# Patient Record
Sex: Female | Born: 1985 | Race: Black or African American | Marital: Single | Smoking: Never smoker
Health system: Southern US, Community
[De-identification: ages and names within clinical notes are randomized; demographics above are authoritative.]

## PROBLEM LIST (undated history)

## (undated) DIAGNOSIS — Z8719 Personal history of other diseases of the digestive system: Secondary | ICD-10-CM

## (undated) DIAGNOSIS — G43909 Migraine, unspecified, not intractable, without status migrainosus: Secondary | ICD-10-CM

## (undated) DIAGNOSIS — L0591 Pilonidal cyst without abscess: Secondary | ICD-10-CM

## (undated) DIAGNOSIS — F419 Anxiety disorder, unspecified: Secondary | ICD-10-CM

## (undated) HISTORY — PX: WISDOM TOOTH EXTRACTION: SHX21

---

## 2018-08-01 ENCOUNTER — Other Ambulatory Visit: Payer: Self-pay | Admitting: General Surgery

## 2018-08-01 DIAGNOSIS — K611 Rectal abscess: Secondary | ICD-10-CM

## 2018-08-13 ENCOUNTER — Ambulatory Visit
Admission: RE | Admit: 2018-08-13 | Discharge: 2018-08-13 | Disposition: A | Discharge status: Home / Self Care | Payer: BLUE CROSS/BLUE SHIELD | Source: Ambulatory Visit | Attending: General Surgery | Admitting: General Surgery

## 2018-08-13 DIAGNOSIS — K611 Rectal abscess: Secondary | ICD-10-CM

## 2018-08-13 MED ORDER — GADOBENATE DIMEGLUMINE 529 MG/ML IV SOLN
12.0000 mL | Freq: Once | INTRAVENOUS | Status: AC | PRN
  Administered 2018-08-13: 12 mL via INTRAVENOUS

## 2018-08-21 ENCOUNTER — Ambulatory Visit: Payer: Self-pay | Admitting: General Surgery

## 2018-08-21 NOTE — H&P (View-Only) (Signed)
  History of Present Illness (Nevaeha Finerty MD; 08/21/2018 9:51 AM) The patient is a 32 year old female who presents with a subcutaneous abscess. She was seen at Urgent Care on 05/23/18 with complaints of anal pain on her left buttock but no infection was found, placed on Bactrim. SHe developed worsening symptoms and I&D of perianal abscess was performed, evacuating purulent material. She followed up with them but was noted to still have drainage so her antibiotics were changed to clindamycin and she was referred to us.  [name] saw her on 05/29/18, she had a large area of induration and central fluctuance on her left buttock. However, bedside I&D revealed only bloody/serosanguineous material.  She states since then, she has had recurrent infections up proximally once a month. One of these required anabiotic's. During this time the wound opened and drained for several days.  Previous hx: She states in November 2016 she started having flareups of swelling to the same area about once a month for a few years. The flares have become less frequent with her last flare being in March. She has never had the area lanced and it has never spontaneously drained purulence in the past. It normally fully resolves with sitz baths. She denies history of Crohn's or ulcerative colitis. She is otherwise healthy, does not have diabetes, does not smoke, and is not on blood thinners.   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 Brown, RMA; 08/21/2018 9:39 AM) No Known Drug Allergies [05/28/2018]: Allergies Reconciled  Medication History (Tanisha A. Brown, RMA; 08/21/2018 9:40  AM) Sulfamethoxazole-Trimethoprim (800-160MG Tablet, Oral) Active. Medications Reconciled  Social History (Domanick Cuccia, MD; 08/21/2018 9:51 AM) Alcohol use Occasional alcohol use. Caffeine use Coffee, Tea. No drug use Tobacco use Never smoker.  Family History (Jaimi Belle, MD; 08/21/2018 9:51 AM) Alcohol Abuse Father. Arthritis Mother. Hypertension Father, Mother.  Pregnancy / Birth History (Steven Basso, MD; 08/21/2018 9:51 AM) Age at menarche 13 years. Contraceptive History Contraceptive implant. Gravida 0 Para 0 Regular periods  Other Problems (Adalind Weitz, MD; 08/21/2018 9:51 AM) Anxiety Disorder Migraine Headache     Review of Systems (Janiylah Hannis MD; 08/21/2018 9:51 AM) General Not Present- Appetite Loss, Chills, Fatigue, Fever, Night Sweats, Weight Gain and Weight Loss. Skin Not Present- Change in Wart/Mole, Dryness, Hives, Jaundice, New Lesions, Non-Healing Wounds, Rash and Ulcer. HEENT Not Present- Earache, Hearing Loss, Hoarseness, Nose Bleed, Oral Ulcers, Ringing in the Ears, Seasonal Allergies, Sinus Pain, Sore Throat, Visual Disturbances, Wears glasses/contact lenses and Yellow Eyes. Respiratory Not Present- Bloody sputum, Chronic Cough, Difficulty Breathing, Snoring and Wheezing. Breast Not Present- Breast Mass, Breast Pain, Nipple Discharge and Skin Changes. Cardiovascular Not Present- Chest Pain, Difficulty Breathing Lying Down, Leg Cramps, Palpitations, Rapid Heart Rate, Shortness of Breath and Swelling of Extremities. Gastrointestinal Not Present- Abdominal Pain, Bloating, Bloody Stool, Change in Bowel Habits, Chronic diarrhea, Constipation, Difficulty Swallowing, Excessive gas, Gets full quickly at meals, Hemorrhoids, Indigestion, Nausea, Rectal Pain and Vomiting. Female Genitourinary Not Present- Frequency, Nocturia, Painful Urination, Pelvic Pain and Urgency. Musculoskeletal Not Present- Back Pain, Joint Pain, Joint Stiffness, Muscle  Pain, Muscle Weakness and Swelling of Extremities. Neurological Not Present- Decreased Memory, Fainting, Headaches, Numbness, Seizures, Tingling, Tremor, Trouble walking and Weakness. Endocrine Not Present-   Cold Intolerance, Excessive Hunger, Hair Changes, Heat Intolerance, Hot flashes and New Diabetes. Hematology Not Present- Blood Thinners, Easy Bruising, Excessive bleeding, Gland problems, HIV and Persistent Infections.  Vitals (Tanisha A. Brown RMA; 08/21/2018 9:39 AM) 08/21/2018 9:39 AM Weight: 133 lb Height: 63in Body Surface Area: 1.63 m Body Mass Index: 23.56 kg/m  Temp.: 98.2F  Pulse: 95 (Regular)  BP: 124/86 (Sitting, Left Arm, Standard)      Physical Exam (Klaryssa Fauth MD; 08/21/2018 9:52 AM)  The physical exam findings are as follows: Note:GENERAL: Well-developed, well nourished female in no acute distress  EYES: No scleral icterus Pupils equal, lids normal  EXTERNAL EARS: Intact, no masses or lesions EXTERNAL NOSE: Intact, no masses or lesions MOUTH: Lips - no lesions Dentition - normal for age  RESPIRATORY: Normal effort, no use of accessory muscles  MUSCULOSKELETAL: Normal gait Grossly normal ROM upper extremities Grossly normal ROM lower extremities  SKIN: Warm and dry Not diaphoretic  PSYCHIATRIC: Normal judgement and insight Normal mood and affect Alert, oriented x 3  Rectal Note: Left superior buttock, adjacent to intergluteal crease: There is an area of induration about 5 cm x 5 cm with no fluctuance No overlying erythema There is an I&D site in the intergluteal cleft which is not draining.    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 I was unable to identify a source for these abscesses and therefore I obtained an MRI. This shows a 1.6 x 0.9 cm superficial collection in the high left medial  gluteal cleft more consistent with pilonidal disease. I do not see any pilonidal pits on exam. She is having recurrent infections approximately once a month. I have recommended excision of this area with primary closure. We have discussed the typical healing time and postoperative pain as well as chance of recurrence. Other risks include bleeding and wound infection.  Current Plans 

## 2018-08-21 NOTE — H&P (Signed)
History of Present Illness Margaret Levee MD; 08/21/2018 9:51 AM) The patient is a 32 year old female who presents with a subcutaneous abscess. She was seen at Urgent Care on 05/23/18 with complaints of anal pain on her left buttock but no infection was found, placed on Bactrim. SHe developed worsening symptoms and I&D of perianal abscess was performed, evacuating purulent material. She followed up with them but was noted to still have drainage so her antibiotics were changed to clindamycin and she was referred to [place].  Margaret Chavez saw her on 05/29/18, she had a large area of induration and central fluctuance on her left buttock. However, bedside I&D revealed only bloody/serosanguineous material.  She states since then, she has had recurrent infections up proximally once a month. One of these required anabiotic's. During this time the wound opened and drained for several days.  Previous hx: She states in November 2016 she started having flareups of swelling to the same area about once a month for a few years. The flares have become less frequent with her last flare being in March. She has never had the area lanced and it has never spontaneously drained purulence in the past. It normally fully resolves with sitz baths. She denies history of Crohn's or ulcerative colitis. She is otherwise healthy, does not have diabetes, does not smoke, and is not on blood thinners.   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 Medications Reconciled  Social History Margaret Levee, MD; 08/21/2018 9:51 AM) Alcohol use Occasional alcohol use. Caffeine use Coffee, Tea. No drug use Tobacco use Never smoker.  Family History Margaret Levee, MD; 08/21/2018 9:51 AM) Alcohol Abuse Margaret Chavez. Arthritis Margaret Chavez. Hypertension Margaret Chavez, Margaret Chavez.  Pregnancy / Birth History Margaret Levee, MD; 08/21/2018 9:51 AM) Age at menarche 13 years. Contraceptive History Contraceptive implant. Gravida 0 Para 0 Regular periods  Other Problems Margaret Levee, MD; 08/21/2018 9:51 AM) Anxiety Disorder Migraine Headache     Review of Systems Margaret Levee MD; 08/21/2018 9:51 AM) General Not Present- Appetite Loss, Chills, Fatigue, Fever, Night Sweats, Weight Gain and Weight Loss. Skin Not Present- Change in Wart/Mole, Dryness, Hives, Jaundice, New Lesions, Non-Healing Wounds, Rash and Ulcer. HEENT Not Present- Earache, Hearing Loss, Hoarseness, Nose Bleed, Oral Ulcers, Ringing in the Ears, Seasonal Allergies, Sinus Pain, Sore Throat, Visual Disturbances, Wears glasses/contact lenses and Yellow Eyes. Respiratory Not Present- Bloody sputum, Chronic Cough, Difficulty Breathing, Snoring and Wheezing. Breast Not Present- Breast Mass, Breast Pain, Nipple Discharge and Skin Changes. Cardiovascular Not Present- Chest Pain, Difficulty Breathing Lying Down, Leg Cramps, Palpitations, Rapid Heart Rate, Shortness of Breath and Swelling of Extremities. Gastrointestinal Not Present- Abdominal Pain, Bloating, Bloody Stool, Change in Bowel Habits, Chronic diarrhea, Constipation, Difficulty Swallowing, Excessive gas, Gets full quickly at meals, Hemorrhoids, Indigestion, Nausea, Rectal Pain and Vomiting. Female Genitourinary Not Present- Frequency, Nocturia, Painful Urination, Pelvic Pain and Urgency. Musculoskeletal Not Present- Back Pain, Joint Pain, Joint Stiffness, Muscle  Pain, Muscle Weakness and Swelling of Extremities. Neurological Not Present- Decreased Memory, Fainting, Headaches, Numbness, Seizures, Tingling, Tremor, Trouble walking and Weakness. Endocrine Not Present-  Cold Intolerance, Excessive Hunger, Hair Changes, Heat Intolerance, Hot flashes and New Diabetes. Hematology Not Present- Blood Thinners, Easy Bruising, Excessive bleeding, Gland problems, HIV and Persistent Infections.  Vitals (Margaret Chavez RMA; 08/21/2018 9:39 AM) 08/21/2018 9:39 AM Weight: 133 lb Height: 63in Body Surface Area: 1.63 m Body Mass Index: 23.56 kg/m  Temp.: 98.108F  Pulse: 95 (Regular)  BP: 124/86 (Sitting, Left Arm, Standard)      Physical Exam Margaret Levee MD; 08/21/2018 9:52 AM)  The physical exam findings are as follows: Note:GENERAL: Well-developed, well nourished female in no acute distress  EYES: No scleral icterus Pupils equal, lids normal  EXTERNAL EARS: Intact, no masses or lesions EXTERNAL NOSE: Intact, no masses or lesions MOUTH: Lips - no lesions Dentition - normal for age  RESPIRATORY: Normal effort, no use of accessory muscles  MUSCULOSKELETAL: Normal gait Grossly normal ROM upper extremities Grossly normal ROM lower extremities  SKIN: Warm and dry Not diaphoretic  PSYCHIATRIC: Normal judgement and insight Normal mood and affect Alert, oriented x 3  Rectal Note: Left superior buttock, adjacent to intergluteal crease: There is an area of induration about 5 cm x 5 cm with no fluctuance No overlying erythema There is an I&D site in the intergluteal cleft which is not draining.    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 I was unable to identify a source for these abscesses and therefore I obtained an MRI. This shows a 1.6 x 0.9 cm superficial collection in the high left medial  gluteal cleft more consistent with pilonidal disease. I do not see any pilonidal pits on exam. She is having recurrent infections approximately once a month. I have recommended excision of this area with primary closure. We have discussed the typical healing time and postoperative pain as well as chance of recurrence. Other risks include bleeding and wound infection.  Current Plans

## 2018-09-06 ENCOUNTER — Encounter (HOSPITAL_BASED_OUTPATIENT_CLINIC_OR_DEPARTMENT_OTHER): Payer: Self-pay | Admitting: *Deleted

## 2018-09-06 ENCOUNTER — Other Ambulatory Visit: Payer: Self-pay

## 2018-09-06 NOTE — Progress Notes (Signed)
Spoke w/ pt via phone for pre-op interview.  Npo after mn.  Arrive at [facility].   Needs urine preg.

## 2018-09-13 ENCOUNTER — Encounter (HOSPITAL_BASED_OUTPATIENT_CLINIC_OR_DEPARTMENT_OTHER): Payer: Self-pay | Admitting: Anesthesiology

## 2018-09-13 ENCOUNTER — Ambulatory Visit (HOSPITAL_BASED_OUTPATIENT_CLINIC_OR_DEPARTMENT_OTHER): Payer: BLUE CROSS/BLUE SHIELD | Admitting: Anesthesiology

## 2018-09-13 ENCOUNTER — Encounter (HOSPITAL_BASED_OUTPATIENT_CLINIC_OR_DEPARTMENT_OTHER)
Admission: RE | Disposition: A | Discharge status: Home / Self Care | Payer: Self-pay | Source: Ambulatory Visit | Attending: General Surgery

## 2018-09-13 ENCOUNTER — Ambulatory Visit (HOSPITAL_BASED_OUTPATIENT_CLINIC_OR_DEPARTMENT_OTHER)
Admission: RE | Admit: 2018-09-13 | Discharge: 2018-09-13 | Disposition: A | Discharge status: Home / Self Care | Payer: BLUE CROSS/BLUE SHIELD | Source: Ambulatory Visit | Attending: General Surgery | Admitting: General Surgery

## 2018-09-13 DIAGNOSIS — L0591 Pilonidal cyst without abscess: Secondary | ICD-10-CM | POA: Insufficient documentation

## 2018-09-13 HISTORY — DX: Anxiety disorder, unspecified: F41.9

## 2018-09-13 HISTORY — DX: Personal history of other diseases of the digestive system: Z87.19

## 2018-09-13 HISTORY — PX: PILONIDAL CYST EXCISION: SHX744

## 2018-09-13 HISTORY — DX: Migraine, unspecified, not intractable, without status migrainosus: G43.909

## 2018-09-13 HISTORY — DX: Pilonidal cyst without abscess: L05.91

## 2018-09-13 LAB — POCT PREGNANCY, URINE: Preg Test, Ur: NEGATIVE

## 2018-09-13 SURGERY — EXCISION, SIMPLE PILONIDAL CYST
Anesthesia: Monitor Anesthesia Care | Site: Coccyx

## 2018-09-13 MED ORDER — SODIUM CHLORIDE 0.9% FLUSH
3.0000 mL | Freq: Two times a day (BID) | INTRAVENOUS | Status: DC
  Filled 2018-09-13: qty 3

## 2018-09-13 MED ORDER — FENTANYL CITRATE (PF) 100 MCG/2ML IJ SOLN
INTRAMUSCULAR | Status: AC
  Filled 2018-09-13: qty 2

## 2018-09-13 MED ORDER — MEPERIDINE HCL 25 MG/ML IJ SOLN
6.2500 mg | INTRAMUSCULAR | Status: DC | PRN
  Filled 2018-09-13: qty 1

## 2018-09-13 MED ORDER — CEFAZOLIN SODIUM-DEXTROSE 2-4 GM/100ML-% IV SOLN
INTRAVENOUS | Status: AC
  Filled 2018-09-13: qty 100

## 2018-09-13 MED ORDER — MIDAZOLAM HCL 2 MG/2ML IJ SOLN
INTRAMUSCULAR | Status: AC
  Filled 2018-09-13: qty 2

## 2018-09-13 MED ORDER — ONDANSETRON HCL 4 MG/2ML IJ SOLN
INTRAMUSCULAR | Status: DC | PRN
  Administered 2018-09-13: 4 mg via INTRAVENOUS

## 2018-09-13 MED ORDER — LIDOCAINE 2% (20 MG/ML) 5 ML SYRINGE
INTRAMUSCULAR | Status: DC | PRN
  Administered 2018-09-13: 25 mg via INTRAVENOUS

## 2018-09-13 MED ORDER — ACETAMINOPHEN 325 MG PO TABS
650.0000 mg | ORAL_TABLET | ORAL | Status: DC | PRN
  Filled 2018-09-13: qty 2

## 2018-09-13 MED ORDER — CEFAZOLIN SODIUM-DEXTROSE 2-4 GM/100ML-% IV SOLN
2.0000 g | INTRAVENOUS | Status: AC
  Administered 2018-09-13: 2 g via INTRAVENOUS
  Filled 2018-09-13: qty 100

## 2018-09-13 MED ORDER — LIDOCAINE 2% (20 MG/ML) 5 ML SYRINGE
INTRAMUSCULAR | Status: AC
  Filled 2018-09-13: qty 5

## 2018-09-13 MED ORDER — PROPOFOL 500 MG/50ML IV EMUL
INTRAVENOUS | Status: AC
  Filled 2018-09-13: qty 50

## 2018-09-13 MED ORDER — ONDANSETRON HCL 4 MG/2ML IJ SOLN
INTRAMUSCULAR | Status: AC
  Filled 2018-09-13: qty 2

## 2018-09-13 MED ORDER — DEXAMETHASONE SODIUM PHOSPHATE 10 MG/ML IJ SOLN
INTRAMUSCULAR | Status: AC
  Filled 2018-09-13: qty 1

## 2018-09-13 MED ORDER — MIDAZOLAM HCL 2 MG/2ML IJ SOLN
INTRAMUSCULAR | Status: DC | PRN
  Administered 2018-09-13: 2 mg via INTRAVENOUS

## 2018-09-13 MED ORDER — SCOPOLAMINE 1 MG/3DAYS TD PT72
MEDICATED_PATCH | TRANSDERMAL | Status: DC | PRN
  Administered 2018-09-13: 1 via TRANSDERMAL

## 2018-09-13 MED ORDER — SCOPOLAMINE 1 MG/3DAYS TD PT72
MEDICATED_PATCH | TRANSDERMAL | Status: AC
  Filled 2018-09-13: qty 1

## 2018-09-13 MED ORDER — BUPIVACAINE-EPINEPHRINE 0.5% -1:200000 IJ SOLN
INTRAMUSCULAR | Status: DC | PRN
  Administered 2018-09-13: 30 mL

## 2018-09-13 MED ORDER — ACETAMINOPHEN 650 MG RE SUPP
650.0000 mg | RECTAL | Status: DC | PRN
  Filled 2018-09-13: qty 1

## 2018-09-13 MED ORDER — PROMETHAZINE HCL 25 MG/ML IJ SOLN
6.2500 mg | INTRAMUSCULAR | Status: DC | PRN
  Filled 2018-09-13: qty 1

## 2018-09-13 MED ORDER — ACETAMINOPHEN 500 MG PO TABS
1000.0000 mg | ORAL_TABLET | ORAL | Status: AC
  Administered 2018-09-13: 1000 mg via ORAL
  Filled 2018-09-13: qty 2

## 2018-09-13 MED ORDER — OXYCODONE HCL 5 MG PO TABS
5.0000 mg | ORAL_TABLET | ORAL | Status: DC | PRN
  Filled 2018-09-13: qty 2

## 2018-09-13 MED ORDER — HYDROCODONE-ACETAMINOPHEN 5-325 MG PO TABS: 1.0000 | ORAL_TABLET | Freq: Four times a day (QID) | ORAL | 0 refills | Status: AC | PRN

## 2018-09-13 MED ORDER — FENTANYL CITRATE (PF) 100 MCG/2ML IJ SOLN
INTRAMUSCULAR | Status: DC | PRN
  Administered 2018-09-13: 25 ug via INTRAVENOUS
  Administered 2018-09-13: 50 ug via INTRAVENOUS
  Administered 2018-09-13: 25 ug via INTRAVENOUS

## 2018-09-13 MED ORDER — HYDROMORPHONE HCL 1 MG/ML IJ SOLN
0.2500 mg | INTRAMUSCULAR | Status: DC | PRN
  Filled 2018-09-13: qty 0.5

## 2018-09-13 MED ORDER — MIDAZOLAM HCL 2 MG/2ML IJ SOLN
0.5000 mg | Freq: Once | INTRAMUSCULAR | Status: DC | PRN
  Filled 2018-09-13: qty 2

## 2018-09-13 MED ORDER — ACETAMINOPHEN 500 MG PO TABS
ORAL_TABLET | ORAL | Status: AC
  Filled 2018-09-13: qty 2

## 2018-09-13 MED ORDER — PROPOFOL 500 MG/50ML IV EMUL
INTRAVENOUS | Status: DC | PRN
  Administered 2018-09-13: 200 ug/kg/min via INTRAVENOUS

## 2018-09-13 MED ORDER — SODIUM CHLORIDE 0.9 % IV SOLN
250.0000 mL | INTRAVENOUS | Status: DC | PRN
  Filled 2018-09-13: qty 250

## 2018-09-13 MED ORDER — LACTATED RINGERS IV SOLN
INTRAVENOUS | Status: DC
  Administered 2018-09-13: 07:00:00 via INTRAVENOUS
  Filled 2018-09-13: qty 1000

## 2018-09-13 MED ORDER — DEXAMETHASONE SODIUM PHOSPHATE 10 MG/ML IJ SOLN
INTRAMUSCULAR | Status: DC | PRN
  Administered 2018-09-13: 5 mg via INTRAVENOUS

## 2018-09-13 MED ORDER — SODIUM CHLORIDE 0.9% FLUSH
3.0000 mL | INTRAVENOUS | Status: DC | PRN
  Filled 2018-09-13: qty 3

## 2018-09-13 SURGICAL SUPPLY — 56 items
BENZOIN TINCTURE PRP APPL 2/3 (GAUZE/BANDAGES/DRESSINGS) ×3 IMPLANT
BLADE EXTENDED COATED 6.5IN (ELECTRODE) IMPLANT
BLADE HEX COATED 2.75 (ELECTRODE) ×3 IMPLANT
BLADE SURG 10 STRL SS (BLADE) IMPLANT
BLADE SURG 11 STRL SS (BLADE) ×3 IMPLANT
BLADE SURG 15 STRL LF DISP TIS (BLADE) ×1 IMPLANT
BLADE SURG 15 STRL SS (BLADE) ×2
BRIEF STRETCH FOR OB PAD LRG (UNDERPADS AND DIAPERS) ×3 IMPLANT
CANISTER SUCT 3000ML PPV (MISCELLANEOUS) ×3 IMPLANT
CHLORAPREP W/TINT 26ML (MISCELLANEOUS) IMPLANT
COVER BACK TABLE 60X90IN (DRAPES) ×3 IMPLANT
COVER MAYO STAND STRL (DRAPES) ×3 IMPLANT
COVER WAND RF STERILE (DRAPES) ×3 IMPLANT
DECANTER SPIKE VIAL GLASS SM (MISCELLANEOUS) IMPLANT
DERMABOND ADVANCED (GAUZE/BANDAGES/DRESSINGS) ×2
DERMABOND ADVANCED .7 DNX12 (GAUZE/BANDAGES/DRESSINGS) ×1 IMPLANT
DRAIN PENROSE 18X1/4 LTX STRL (WOUND CARE) ×3 IMPLANT
DRAPE LAPAROTOMY 100X72 PEDS (DRAPES) ×3 IMPLANT
DRAPE SHEET LG 3/4 BI-LAMINATE (DRAPES) IMPLANT
DRAPE UTILITY XL STRL (DRAPES) ×3 IMPLANT
ELECT REM PT RETURN 9FT ADLT (ELECTROSURGICAL) ×3
ELECTRODE REM PT RTRN 9FT ADLT (ELECTROSURGICAL) ×1 IMPLANT
GAUZE SPONGE 4X4 12PLY STRL (GAUZE/BANDAGES/DRESSINGS) ×3 IMPLANT
GAUZE SPONGE 4X4 16PLY NS LF (WOUND CARE) IMPLANT
GAUZE VASELINE 3X9 (GAUZE/BANDAGES/DRESSINGS) IMPLANT
GLOVE BIO SURGEON STRL SZ 6.5 (GLOVE) ×2 IMPLANT
GLOVE BIO SURGEONS STRL SZ 6.5 (GLOVE) ×1
GLOVE BIOGEL PI IND STRL 7.0 (GLOVE) ×1 IMPLANT
GLOVE BIOGEL PI INDICATOR 7.0 (GLOVE) ×2
GOWN STRL REUS W/TWL 2XL LVL3 (GOWN DISPOSABLE) ×3 IMPLANT
KIT TURNOVER CYSTO (KITS) ×3 IMPLANT
NDL SAFETY ECLIPSE 18X1.5 (NEEDLE) ×1 IMPLANT
NEEDLE HYPO 18GX1.5 SHARP (NEEDLE) ×2
NEEDLE HYPO 22GX1.5 SAFETY (NEEDLE) ×3 IMPLANT
NS IRRIG 500ML POUR BTL (IV SOLUTION) ×3 IMPLANT
PACK BASIN DAY SURGERY FS (CUSTOM PROCEDURE TRAY) ×3 IMPLANT
PAD ABD 8X10 STRL (GAUZE/BANDAGES/DRESSINGS) ×3 IMPLANT
PAD ARMBOARD 7.5X6 YLW CONV (MISCELLANEOUS) ×3 IMPLANT
PENCIL BUTTON HOLSTER BLD 10FT (ELECTRODE) ×3 IMPLANT
SPONGE LAP 18X18 RF (DISPOSABLE) IMPLANT
SPONGE LAP 4X18 RFD (DISPOSABLE) IMPLANT
SPONGE SURGIFOAM ABS GEL 12-7 (HEMOSTASIS) IMPLANT
SUT ETHILON 2 0 PS N (SUTURE) ×3 IMPLANT
SUT VIC AB 2-0 SH 18 (SUTURE) IMPLANT
SUT VIC AB 2-0 SH 27 (SUTURE) ×2
SUT VIC AB 2-0 SH 27XBRD (SUTURE) ×1 IMPLANT
SUT VIC AB 3-0 SH 18 (SUTURE) ×3 IMPLANT
SUT VIC AB 3-0 SH 27 (SUTURE)
SUT VIC AB 3-0 SH 27X BRD (SUTURE) IMPLANT
SYR BULB IRRIGATION 50ML (SYRINGE) ×3 IMPLANT
SYR CONTROL 10ML LL (SYRINGE) ×3 IMPLANT
TOWEL OR 17X24 6PK STRL BLUE (TOWEL DISPOSABLE) ×6 IMPLANT
TRAY DSU PREP LF (CUSTOM PROCEDURE TRAY) ×3 IMPLANT
TUBE CONNECTING 12'X1/4 (SUCTIONS) ×1
TUBE CONNECTING 12X1/4 (SUCTIONS) ×2 IMPLANT
YANKAUER SUCT BULB TIP NO VENT (SUCTIONS) ×3 IMPLANT

## 2018-09-13 NOTE — Discharge Instructions (Addendum)
GENERAL SURGERY: POST OP INSTRUCTIONS  1. DIET: Follow a light bland diet the first 24 hours after arrival home, such as soup, liquids, crackers, etc.  Be sure to include lots of fluids daily.  Avoid fast food or heavy meals as your are more likely to get nauseated.   2. Take your usually prescribed home medications unless otherwise directed. 3. PAIN CONTROL: a. Pain is best controlled by a usual combination of three different methods TOGETHER: i. Ice/Heat ii. Over the counter pain medication iii. Prescription pain medication b. Most patients will experience some swelling and bruising around the incisions.  Ice packs or heating pads (30-60 minutes up to 6 times a day) will help. Use ice for the first few days to help decrease swelling and bruising, then switch to heat to help relax tight/sore spots and speed recovery.  Some people prefer to use ice alone, heat alone, alternating between ice & heat.  Experiment to what works for you.  Swelling and bruising can take several weeks to resolve.   c. It is helpful to take an over-the-counter pain medication regularly for the first few weeks.  Choose one of the following that works best for you: i. Naproxen (Aleve, etc)  Two 220mg  tabs twice a day ii. Ibuprofen (Advil, etc) Three 200mg  tabs four times a day (every meal & bedtime) d. A  prescription for pain medication (such as Percocet, oxycodone, hydrocodone, etc) should be given to you upon discharge.  Take your pain medication as prescribed.  i. If you are having problems/concerns with the prescription medicine (does not control pain, nausea, vomiting, rash, itching, etc), please call us [phone] to see if we need to switch you to a different pain medicine that will work better for you and/or control your side effect better. ii. If you need a refill on your pain medication, please contact your pharmacy.  They will contact our office to request authorization. Prescriptions will not be filled after 5  pm or on week-ends. 4. Avoid getting constipated.  Between the surgery and the pain medications, it is common to experience some constipation.  Increasing fluid intake and taking a fiber supplement (such as Metamucil, Citrucel, FiberCon, MiraLax, etc) 1-2 times a day regularly will usually help prevent this problem from occurring.  A mild laxative (prune juice, Milk of Magnesia, MiraLax, etc) should be taken according to package directions if there are no bowel movements after 48 hours.   5.  You may shower after 48 hours after you removed the dressing.  Continue to shower every day over incision(s) after the dressing is off. 6. Keep your incision covered and change the dressing daily and as needed.    7. ACTIVITIES as tolerated:   a. You may resume regular (light) daily activities beginning the next day--such as daily self-care, walking, climbing stairs--gradually increasing activities as tolerated.  If you can walk 30 minutes without difficulty, it is safe to try more intense activity such as jogging, treadmill, bicycling, low-impact aerobics, swimming, etc. b. Save the most intensive and strenuous activity for last such as sit-ups, heavy lifting, contact sports, etc  Refrain from any heavy lifting or straining until you are off narcotics for pain control.   c. DO NOT PUSH THROUGH PAIN.  Let pain be your guide: If it hurts to do something, don't do it.  Pain is your body warning you to avoid that activity for another week until the pain goes down. d. You may drive when you are no  longer taking prescription pain medication, you can comfortably wear a seatbelt, and you can safely maneuver your car and apply brakes. e. Bonita QuinYou may have sexual intercourse when it is comfortable.  8. FOLLOW UP in our office a. Please call [facility] at [phone] to set up an appointment to see your surgeon in the office for a follow-up appointment approximately 2-3 weeks after your surgery. b. Make sure that you call for this  appointment the day you arrive home to insure a convenient appointment time. 9. IF YOU HAVE DISABILITY OR FAMILY LEAVE FORMS, BRING THEM TO THE OFFICE FOR PROCESSING.  DO NOT GIVE THEM TO YOUR DOCTOR.   WHEN TO CALL US [phone]: 1. Poor pain control 2. Reactions / problems with new medications (rash/itching, nausea, etc)  3. Fever over 101.5 F (38.5 C) 4. Worsening swelling or bruising 5. Continued bleeding from incision. 6. Increased pain, redness, or drainage from the incision   The clinic staff is available to answer your questions during regular business hours (8:30am-5pm).  Please dont hesitate to call and ask to speak to one of our nurses for clinical concerns.   If you have a medical emergency, go to the nearest emergency room or call 911.  [facility] call at the [facility]   [facility], [place] [address], [address], [place], [place]  [address] ? MAIN: [phone] ? TOLL FREE: [phone] ?  FAX [phone] [email or web address]   Post Anesthesia Home Care Instructions  Activity: Get plenty of rest for the remainder of the day. A responsible individual must stay with you for 24 hours following the procedure.  For the next 24 hours, DO NOT: -Drive a car -Advertising copywriterperate machinery -Drink alcoholic beverages -Take any medication unless instructed by your physician -Make any legal decisions or sign important papers.  Meals: Start with liquid foods such as gelatin or soup. Progress to regular foods as tolerated. Avoid greasy, spicy, heavy foods. If nausea and/or vomiting occur, drink only clear liquids until the nausea and/or vomiting subsides. Call your physician if vomiting continues.  Special Instructions/Symptoms: Your throat may feel dry or sore from the anesthesia or the breathing tube placed in your throat during surgery. If this causes discomfort, gargle with warm salt water. The discomfort  should disappear within 24 hours.  If you had a scopolamine patch placed behind your ear for the management of post- operative nausea and/or vomiting:  1. The medication in the patch is effective for 72 hours, after which it should be removed.  Wrap patch in a tissue and discard in the trash. Wash hands thoroughly with soap and water. 2. You may remove the patch earlier than 72 hours if you experience unpleasant side effects which may include dry mouth, dizziness or visual disturbances. 3. Avoid touching the patch. Wash your hands with soap and water after contact with the patch.

## 2018-09-13 NOTE — Interval H&P Note (Signed)
History and Physical Interval Note:  09/13/2018 7:20 AM  Margaret Chavez  has presented today for surgery, with the diagnosis of PILONIDAL CYST  The various methods of treatment have been discussed with the patient and family. After consideration of risks, benefits and other options for treatment, the patient has consented to  Procedure(s): EXCISION OF PILONIDAL CYST (N/A) as a surgical intervention .  The patient's history has been reviewed, patient examined, no change in status, stable for surgery.  I have reviewed the patient's chart and labs.  Questions were answered to the patient's satisfaction.     Vanita PandaAlicia C Dan Dissinger, MD  Colorectal and General Surgery [facility]

## 2018-09-13 NOTE — Transfer of Care (Signed)
Immediate Anesthesia Transfer of Care Note  Patient: Margaret Chavez  Procedure(s) Performed: EXCISION OF PILONIDAL CYST (N/A Coccyx)  Patient Location: PACU  Anesthesia Type:MAC  Level of Consciousness: awake, alert , oriented and patient cooperative  Airway & Oxygen Therapy: Patient Spontanous Breathing and Patient connected to nasal cannula oxygen  Post-op Assessment: Report given to RN and Post -op Vital signs reviewed and stable  Post vital signs: Reviewed and stable  Last Vitals:  Vitals Value Taken Time  BP    Temp    Pulse    Resp    SpO2      Last Pain:  Vitals:   09/13/18 0639  TempSrc: Oral         Complications: No apparent anesthesia complications

## 2018-09-13 NOTE — Anesthesia Procedure Notes (Signed)
Procedure Name: MAC Date/Time: 09/13/2018 8:20 AM Performed by: Wanita Chamberlain, CRNA Pre-anesthesia Checklist: Patient identified, Timeout performed, Emergency Drugs available, Suction available and Patient being monitored Patient Re-evaluated:Patient Re-evaluated prior to induction Oxygen Delivery Method: Nasal cannula Induction Type: IV induction Placement Confirmation: CO2 detector,  positive ETCO2 and breath sounds checked- equal and bilateral Dental Injury: Teeth and Oropharynx as per pre-operative assessment

## 2018-09-13 NOTE — Anesthesia Postprocedure Evaluation (Signed)
Anesthesia Post Note  Patient: Margaret Chavez  Procedure(s) Performed: EXCISION OF PILONIDAL CYST (N/A Coccyx)     Patient location during evaluation: PACU Anesthesia Type: MAC Level of consciousness: awake and alert, oriented and patient cooperative Pain management: pain level controlled Vital Signs Assessment: post-procedure vital signs reviewed and stable Respiratory status: spontaneous breathing, nonlabored ventilation and respiratory function stable Cardiovascular status: blood pressure returned to baseline and stable Postop Assessment: no apparent nausea or vomiting, adequate PO intake and able to ambulate Anesthetic complications: no    Last Vitals:  Vitals:   09/13/18 0900 09/13/18 0920  BP: 127/87 125/84  Pulse: (!) 115 84  Resp: 19 12  Temp: 36.8 C   SpO2: 100% 100%    Last Pain:  Vitals:   09/13/18 0900  TempSrc:   PainSc: 0-No pain                 Vianca Bracher,E. Markon Jares

## 2018-09-13 NOTE — Anesthesia Preprocedure Evaluation (Addendum)
Anesthesia Evaluation  Patient identified by MRN, date of birth, ID band Patient awake    Reviewed: Allergy & Precautions, NPO status , Patient's Chart, lab work & pertinent test results  History of Anesthesia Complications Negative for: history of anesthetic complications  Airway Mallampati: I  TM Distance: >3 FB Neck ROM: Full    Dental  (+) Teeth Intact, Dental Advisory Given,    Pulmonary neg pulmonary ROS,    breath sounds clear to auscultation       Cardiovascular negative cardio ROS   Rhythm:Regular Rate:Normal     Neuro/Psych negative neurological ROS     GI/Hepatic negative GI ROS, Neg liver ROS,   Endo/Other  negative endocrine ROS  Renal/GU negative Renal ROS     Musculoskeletal   Abdominal   Peds  Hematology negative hematology ROS (+)   Anesthesia Other Findings   Reproductive/Obstetrics Nexplanon                          Anesthesia Physical Anesthesia Plan  ASA: I  Anesthesia Plan: MAC   Post-op Pain Management:    Induction:   PONV Risk Score and Plan: 2 and Ondansetron, Dexamethasone and Scopolamine patch - Pre-op  Airway Management Planned: Nasal Cannula and Natural Airway  Additional Equipment:   Intra-op Plan:   Post-operative Plan:   Informed Consent: I have reviewed the patients History and Physical, chart, labs and discussed the procedure including the risks, benefits and alternatives for the proposed anesthesia with the patient or authorized representative who has indicated his/her understanding and acceptance.   Dental advisory given  Plan Discussed with: CRNA and Surgeon  Anesthesia Plan Comments: (Plan routine monitors, MAC)        Anesthesia Quick Evaluation

## 2018-09-13 NOTE — Op Note (Signed)
09/13/2018  8:15 AM  PATIENT:  Margaret Chavez  32 y.o. female  Patient Care Team: Patient, No Pcp Per as PCP - General (General Practice)  PRE-OPERATIVE DIAGNOSIS:  PILONIDAL CYST  POST-OPERATIVE DIAGNOSIS:  PILONIDAL CYST  PROCEDURE:   EXCISION OF PILONIDAL CYST    Surgeon(s): Leighton Ruff, MD  ASSISTANT: none   ANESTHESIA:   local and MAC  SPECIMEN:  Source of Specimen:  pilonidal cyst  DISPOSITION OF SPECIMEN:  PATHOLOGY  COUNTS:  YES  PLAN OF CARE: Discharge to home after PACU  PATIENT DISPOSITION:  PACU - hemodynamically stable.  INDICATION: 32 y.o. F with recurrent buttock drainage.  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  MAC anesthesia was induced without difficulty. The patient was then positioned in prone jackknife position with buttocks gently taped apart.  The patient was then prepped and draped in usual sterile fashion.  SCDs were noted to be in place prior to the initiation of anesthesia. A surgical timeout was performed indicating the correct patient, procedure, positioning and need for preoperative antibiotics.  A rectal block was performed using Marcaine with epinephrine.   I identified the area of pilonidal disease on the patient's left inferior gluteus.  I made an incision lateral to this with a 10 blade scalpel.  Dissection was carried down underneath the area of inflammation using Bovie electrocautery.  An S-shaped fistula probe was placed into the midline pilonidal pit which was draining some purulence.  The cavity was small.  I used a fistula probe to delineate the edges of the cavity and remove this in total.  I used Metzenbaum scissors to dissect the cavity away from the skin edge.  This was then sent to pathology for further examination.  The midline pilonidal pit was closed  using 2 figure-of-eight 3-0 Vicryl sutures.  I then excised the skin around the pit using an 11 blade scalpel.  This was also closed with two 3-0 Vicryl erupted sutures.  I mobilized the lateral fat pad using electrocautery and used 2-0 Vicryl sutures to reapproximate this underneath the wound.  I then placed 1/4 inch Penrose drain into the base of the wound and brought brought it out through the apex.  I used 2-0 Vicryl sutures to close the dermal layer.  I then used 2-0 nylon mattress sutures to close the skin.  A nylon was also used to secure the drain in the apex of the wound.  Dermabond was placed over the midline pilonidal pit excision and a sterile dressing was placed over the rest.  Patient tolerated the procedure well was awakened from anesthesia and sent to the post anesthesia care unit in stable condition.  All counts were correct per operating room staff.   I have reviewed the Cook for this patient.

## 2018-09-14 ENCOUNTER — Encounter (HOSPITAL_BASED_OUTPATIENT_CLINIC_OR_DEPARTMENT_OTHER): Payer: Self-pay | Admitting: General Surgery

## 2020-02-18 IMAGING — MR MR PELVIS WO/W CM
8 of 9 series · 43 of 48 positions shown · IV contrast (12 ml multihance)
Comparison: None.

CLINICAL DATA: Reported history of perirectal abscess.

EXAM:
MRI PELVIS WITHOUT AND WITH CONTRAST
TECHNIQUE: Multiplanar multisequence MR imaging of the pelvis was performed
both before and after administration of intravenous contrast.
CONTRAST:  12mL MULTIHANCE GADOBENATE DIMEGLUMINE 529 MG/ML IV SOLN

[Series 3: t2_tse_sag · sagittal · 2.5mm · 0.81mm/px · 8 of 50 slices shown]
[im 1/50]
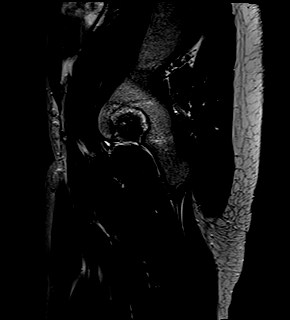
[im 8/50]
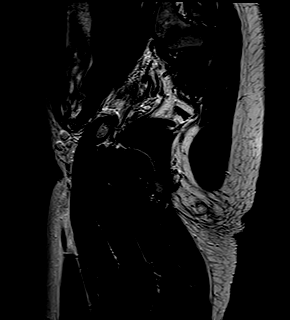
[im 15/50]
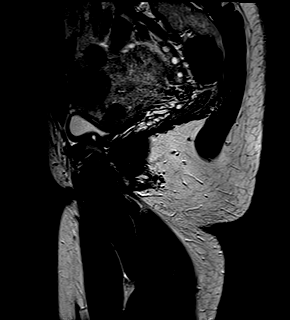
[im 22/50]
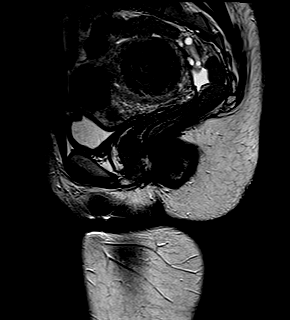
[im 29/50]
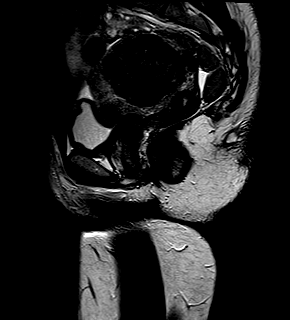
[im 36/50]
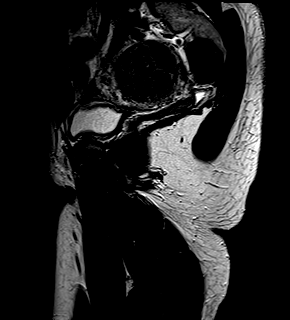
[im 43/50]
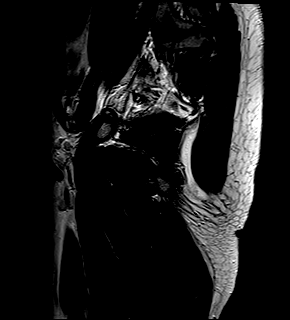
[im 50/50]
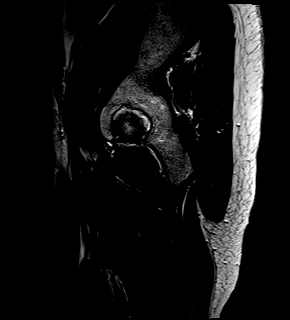

[Series 4: t2_tse_sag fs · sagittal · 2.5mm · 0.81mm/px · 7 of 50 slices shown]
[im 1/50]
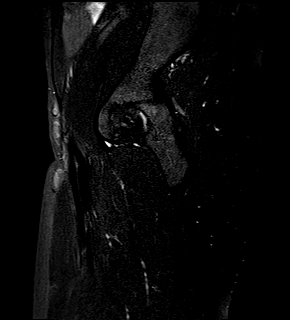
[im 9/50]
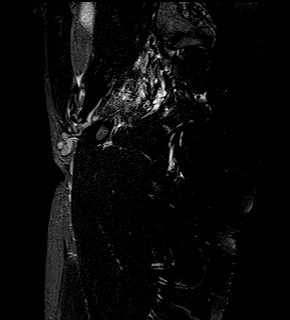
[im 17/50]
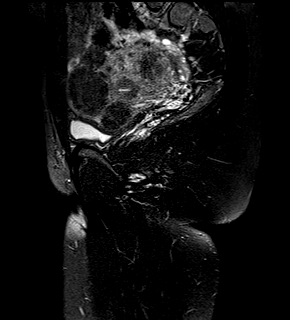
[im 25/50]
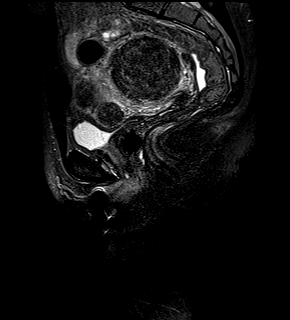
[im 33/50]
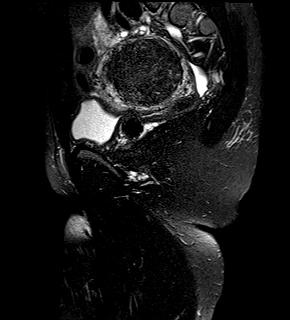
[im 41/50]
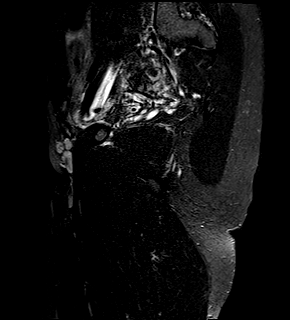
[im 50/50]
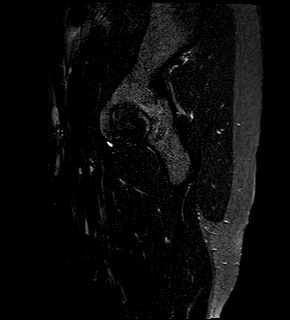

[Series 5: t1_tse axial obl · axial · 4.0mm · 0.69mm/px · z∈[-106,+70]mm · 5 of 40 slices shown (1 of 3)]
[im 1/40]
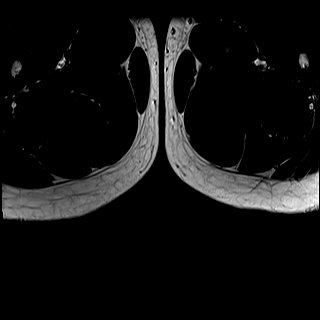
[im 10/40]
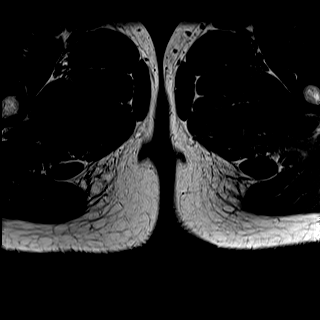
[im 20/40]
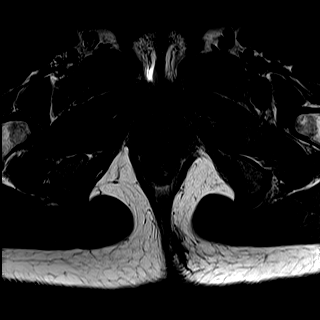
[im 30/40]
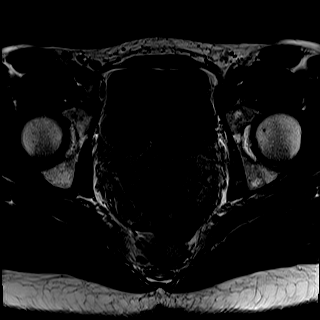
[im 40/40]
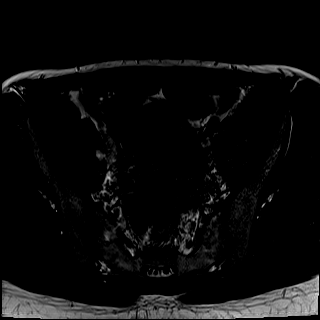

[Series 6: t2_tse axial obl · axial · 4.0mm · 0.69mm/px · z∈[-106,+70]mm · 5 of 40 slices shown (1 of 2)]
[im 1/40]
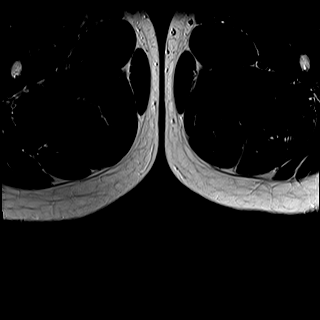
[im 10/40]
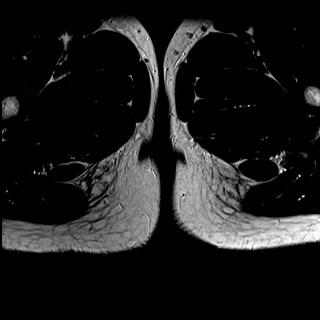
[im 20/40]
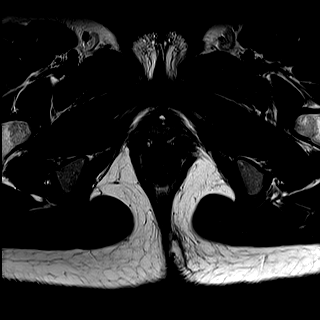
[im 30/40]
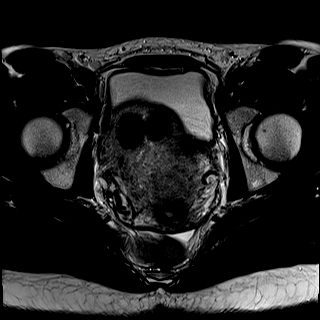
[im 40/40]
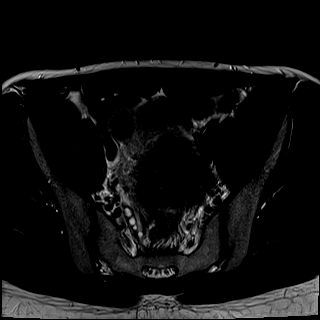

[Series 7: t2_tse axial obl · axial · 4.0mm · 0.69mm/px · z∈[-106,+70]mm · 5 of 40 slices shown (2 of 2)]
[im 1/40]
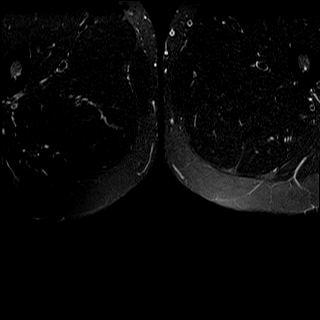
[im 10/40]
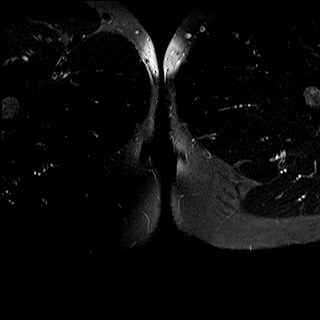
[im 20/40]
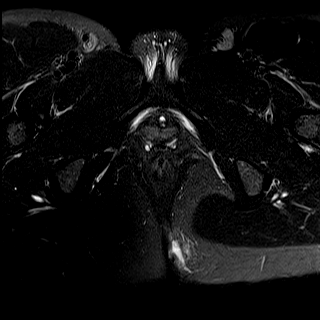
[im 30/40]
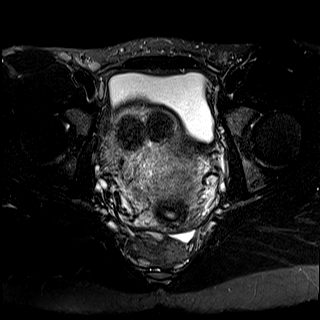
[im 40/40]
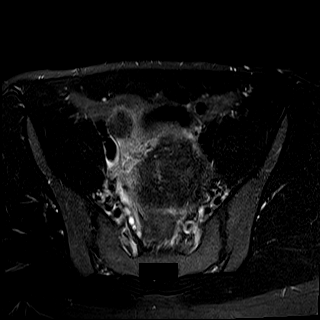

[Series 8: T2 fat-sat · coronal · 4.0mm · 0.75mm/px · 4 of 32 slices shown]
[im 1/32]
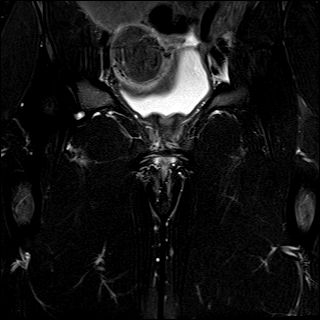
[im 11/32]
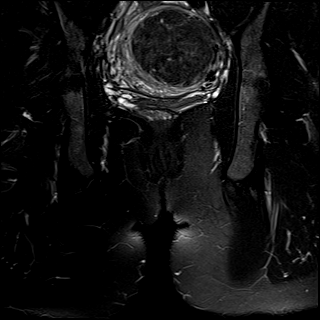
[im 21/32]
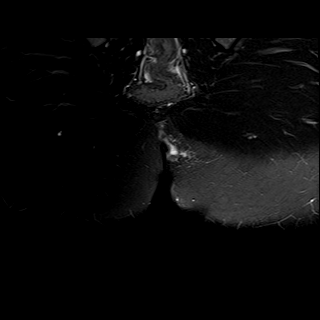
[im 32/32]
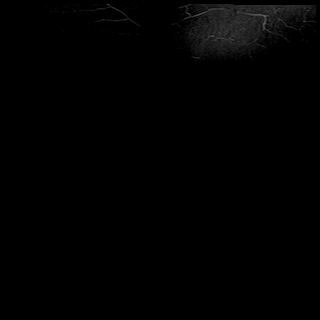

[Series 9: t1_tse axial obl · axial · non-contrast · 4.0mm · 0.62mm/px · z∈[-101,+57]mm · 5 of 36 slices shown (2 of 3)]
[im 1/36]
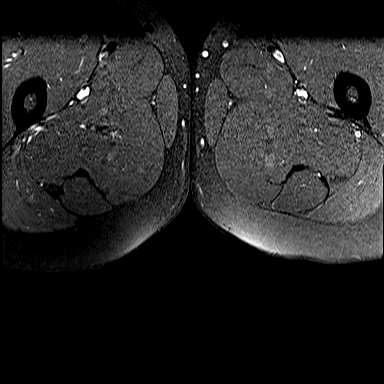
[im 9/36]
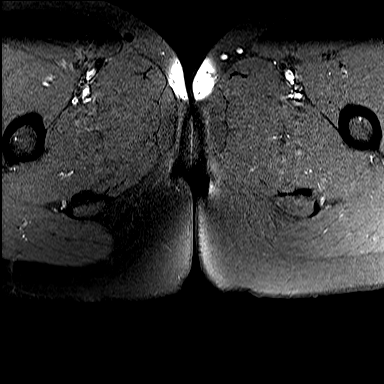
[im 18/36]
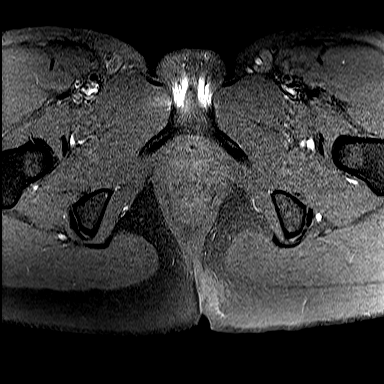
[im 27/36]
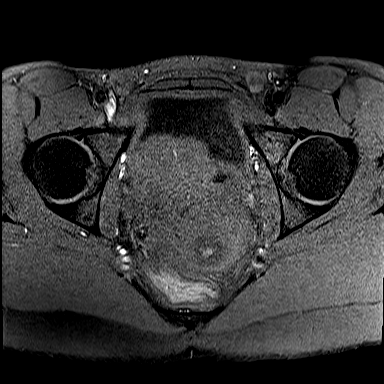
[im 36/36]
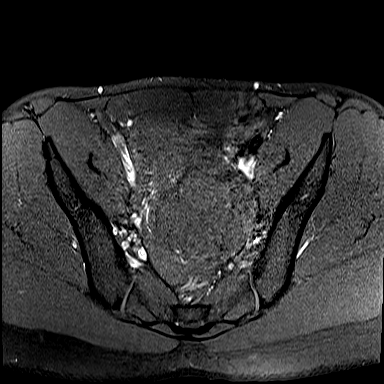

[Series 10: t1_tse axial obl · axial · 4.0mm · 0.62mm/px · z∈[-101,+16]mm · 4 of 36 slices shown (3 of 3)]
[im 1/36]
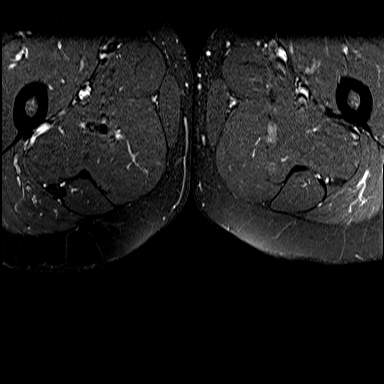
[im 9/36]
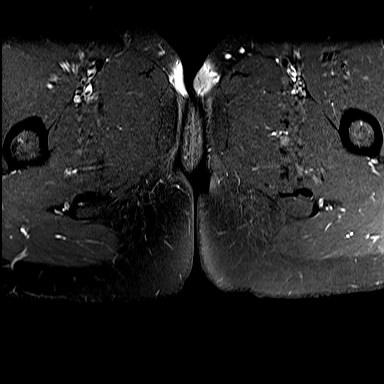
[im 18/36]
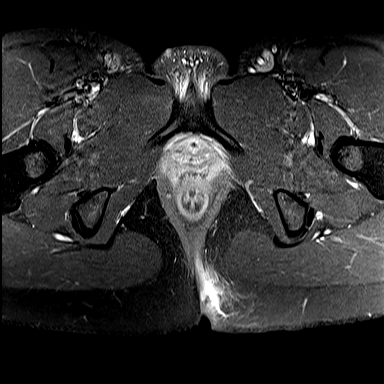
[im 27/36]
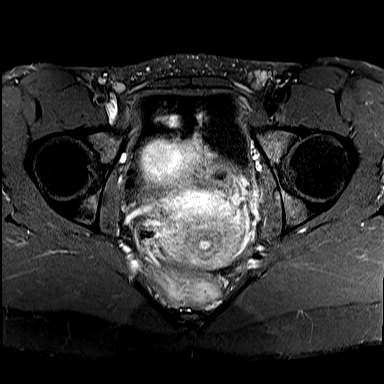

[43 of 48 positions shown; findings below may reference images not displayed]

FINDINGS: Urinary Tract: Normal bladder. There is a tiny 4 x 3 mm cystic
lesion at urethral meatus (series 7/image 21) without appreciable
enhancement or wall thickening, most compatible with Yessika Moua gland
cyst.

Bowel: No convincing perianal or perirectal fistula. No bowel wall
thickening in the pelvis.

Vascular/Lymphatic: No pathologically enlarged pelvic lymph nodes.
No acute vascular abnormality.

Reproductive: Limited visualization of enlarged myomatous anteverted
uterus with dominant 6.8 x 5.8 x 6.4 cm left posterior uterine body
intramural fibroid. Normal ovaries. No evidence of adnexal mass.

Other: There is a small 1.6 x 0.6 x 0.9 cm superficial collection in
the high left medial gluteal cleft (series 7/image 21). There is
ill-defined curvilinear edema and enhancement tracking from this
collection superior medially to the region of the tip of the coccyx.
No convincing communication of this process with the anus or rectum.

Musculoskeletal: No aggressive appearing focal osseous lesions.
IMPRESSION: 1. Small 1.6 x 0.6 x 0.9 cm superficial collection in the high left
medial gluteal cleft, with ill-defined curvilinear edema and
enhancement tracking from this collection superomedially to the
region of the tip of the coccyx, most suggestive of a pilonidal
cyst. No convincing perianal or perirectal fistula.
2. Enlarged myomatous uterus.
3. Tiny 4 mm cystic lesion at the urethral meatus, most compatible
with Yessika Moua gland cyst.
